# Patient Record
Sex: Female | Born: 2000 | Race: Black or African American | Hispanic: No | Marital: Single | Smoking: Never smoker
Health system: Southern US, Community
[De-identification: ages and names within clinical notes are randomized; demographics above are authoritative.]

## PROBLEM LIST (undated history)

## (undated) DIAGNOSIS — A549 Gonococcal infection, unspecified: Secondary | ICD-10-CM

## (undated) HISTORY — PX: WISDOM TOOTH EXTRACTION: SHX21

---
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
## 2018-04-10 ENCOUNTER — Emergency Department (HOSPITAL_COMMUNITY): Payer: Self-pay

## 2018-04-10 ENCOUNTER — Emergency Department (HOSPITAL_COMMUNITY)
Admission: EM | Admit: 2018-04-10 | Discharge: 2018-04-10 | Disposition: A | Discharge status: Home / Self Care | Payer: Self-pay | Attending: Emergency Medicine | Admitting: Emergency Medicine

## 2018-04-10 ENCOUNTER — Encounter (HOSPITAL_COMMUNITY): Payer: Self-pay

## 2018-04-10 DIAGNOSIS — R51 Headache: Secondary | ICD-10-CM | POA: Insufficient documentation

## 2018-04-10 DIAGNOSIS — R0981 Nasal congestion: Secondary | ICD-10-CM | POA: Insufficient documentation

## 2018-04-10 DIAGNOSIS — R61 Generalized hyperhidrosis: Secondary | ICD-10-CM | POA: Insufficient documentation

## 2018-04-10 DIAGNOSIS — J3489 Other specified disorders of nose and nasal sinuses: Secondary | ICD-10-CM | POA: Insufficient documentation

## 2018-04-10 DIAGNOSIS — M79621 Pain in right upper arm: Secondary | ICD-10-CM

## 2018-04-10 DIAGNOSIS — R2231 Localized swelling, mass and lump, right upper limb: Secondary | ICD-10-CM | POA: Insufficient documentation

## 2018-04-10 DIAGNOSIS — Z113 Encounter for screening for infections with a predominantly sexual mode of transmission: Secondary | ICD-10-CM | POA: Insufficient documentation

## 2018-04-10 DIAGNOSIS — R0602 Shortness of breath: Secondary | ICD-10-CM | POA: Insufficient documentation

## 2018-04-10 DIAGNOSIS — R2232 Localized swelling, mass and lump, left upper limb: Secondary | ICD-10-CM | POA: Insufficient documentation

## 2018-04-10 MED ORDER — CETIRIZINE HCL 10 MG PO TABS: 10.0000 mg | ORAL_TABLET | Freq: Every day | ORAL | 0 refills | Status: AC

## 2018-04-10 NOTE — ED Triage Notes (Signed)
Pt from home c/o bilateral swollen axillas, sinus congestion, headaches, and intermittent back and extremity pain. Pt reports only pain currently is 3/10 HA and right armpit x months. Pt also endorses extremity tingling, none currently. Pt reports she was seen previously and they ruled out abscesses in axilla, but pain has still persisted.

## 2018-04-10 NOTE — Discharge Instructions (Addendum)
The cause of your arm pain was not identified today. Please follow-up to establish family doctor for recheck and further evaluation. You can take ibuprofen, available over-the-counter for pain.

## 2018-04-10 NOTE — ED Provider Notes (Signed)
[facility] EMERGENCY DEPARTMENT Provider Note   CSN: [id number] Arrival date & time: 04/10/18  1534     History   Chief Complaint Chief Complaint  Patient presents with  . Headache    HPI Jamie Anthony is a 18 y.o. female.  The history is provided by the patient and medical records. No language interpreter was used.  Headache   presents to the emergency department complaining of pain in her axilla. She reports swelling and nodules intermittently in her axilla bilaterally for the last several months. Swelling is greater in the right than the left. She denies any fevers. She has occasional shortness of breath, none currently. She has experienced night sweats for her entire life. She does have 80 pounds weight loss over the last year but she has been trying to lose weight. She is concerned that she might have HIV. She is sexually active but uses protection. She also complains of sinus congestion, intermittent back pain as well as nasal congestion and drainage since August. Past Medical History:  Diagnosis Date  . Gonorrhea     There are no active problems to display for this patient.   History reviewed. No pertinent surgical history.   OB History   No obstetric history on file.      Home Medications    Prior to Admission medications   Medication Sig Start Date End Date Taking? Authorizing Provider  ibuprofen (ADVIL,MOTRIN) 200 MG tablet Take 400 mg by mouth every 6 (six) hours as needed for headache.    [provider]  IRON PO Take 1 tablet by mouth daily as needed (For when she is feeling low.).    [provider]  lidocaine (XYLOCAINE) 2 % solution Use as directed 15 mLs in the mouth or throat as needed for mouth pain. 12/22/17   Mortis, Jerrel Ivory I, PA-C  metroNIDAZOLE (FLAGYL) 500 MG tablet Take 1 tablet (500 mg total) by mouth 2 (two) times daily. One po bid x 7 days Patient not taking: Reported on 12/22/2017 12/17/17   Molpus,  Jonny Ruiz, MD    Family History History reviewed. No pertinent family history.  Social History Social History   Tobacco Use  . Smoking status: Never Smoker  . Smokeless tobacco: Never Used  Substance Use Topics  . Alcohol use: No    Frequency: Never  . Drug use: No     Allergies   Patient has no known allergies.   Review of Systems Review of Systems  Neurological: Positive for headaches.  All other systems reviewed and are negative.    Physical Exam Updated Vital Signs BP 120/60 (BP Location: Right Arm)   Pulse 62   Temp 98.1 F (36.7 C) (Oral)   Resp 16   Ht 5\' 7"  (1.702 m)   Wt 81.6 kg   LMP 04/03/2018   SpO2 100%   BMI 28.19 kg/m   Physical Exam Vitals signs and nursing note reviewed.  Constitutional:      Appearance: She is well-developed.  HENT:     Head: Normocephalic and atraumatic.     Nose: Nose normal. No rhinorrhea.     Mouth/Throat:     Mouth: Mucous membranes are moist.  Cardiovascular:     Rate and Rhythm: Normal rate and regular rhythm.     Heart sounds: No murmur.  Pulmonary:     Effort: Pulmonary effort is normal. No respiratory distress.     Breath sounds: Normal breath sounds.  Abdominal:  Palpations: Abdomen is soft.     Tenderness: There is no abdominal tenderness. There is no guarding or rebound.  Musculoskeletal:        General: No tenderness.     Comments: No pathologic axillary or cervical lymphadenopathy  Skin:    General: Skin is warm and dry.     Capillary Refill: Capillary refill takes less than 2 seconds.  Neurological:     Mental Status: She is alert and oriented to person, place, and time.     Comments: MAE symmetrically  Psychiatric:        Behavior: Behavior normal.      ED Treatments / Results  Labs (all labs ordered are listed, but only abnormal results are displayed) Labs Reviewed  RPR  HIV ANTIBODY (ROUTINE TESTING W REFLEX)    EKG None  Radiology No results found.  Procedures Procedures  (including critical care time)  Medications Ordered in ED Medications - No data to display   Initial Impression / Assessment and Plan / ED Course  I have reviewed the triage vital signs and the nursing notes.  Pertinent labs & imaging results that were available during my care of the patient were reviewed by me and considered in my medical decision making (see chart for details).     Patient here for evaluation of axillary pain, nasal congestion. She is non-toxic appearing on evaluation. She is requesting HIV testing. She has no vaginally complaints. She is sexually active but uses protection. On examination there is no pathologic lymphadenopathy or masses. No evidence of abscess. She does report night sweats but these have been a lifelong issue and not a new thing for her. She also reports weight loss but she is been attempting to lose weight. No overt clinical findings consistent with malignancy. Discussed importance of PCP follow-up for further evaluation and recheck. She also reports chronic nasal congestion and drainage. Discussed some element of allergies as possible, will try Zyrtec. Return precautions discussed.  Final Clinical Impressions(s) / ED Diagnoses   Final diagnoses:  None    ED Discharge Orders    None       Tilden Fossaees, Nerida Boivin, MD 04/10/18 Flossie [id number]

## 2018-04-10 NOTE — ED Notes (Signed)
Pt verbalized understanding of discharge instructions and denies any further questions at this time.   

## 2018-04-10 NOTE — ED Notes (Signed)
Pt ambulated to bathroom independently. Will go to X-Ray after returning.

## 2018-04-10 NOTE — ED Notes (Signed)
Patient transported to X-ray 

## 2018-04-10 NOTE — ED Notes (Signed)
ED Provider at bedside. 

## 2018-04-11 LAB — RPR: RPR Ser Ql: NONREACTIVE

## 2018-04-11 LAB — HIV ANTIBODY (ROUTINE TESTING W REFLEX): HIV Screen 4th Generation wRfx: NONREACTIVE

## 2020-03-19 ENCOUNTER — Other Ambulatory Visit: Payer: Self-pay

## 2020-03-19 ENCOUNTER — Encounter (HOSPITAL_COMMUNITY): Payer: Self-pay

## 2020-03-19 ENCOUNTER — Emergency Department (HOSPITAL_COMMUNITY)
Admission: EM | Admit: 2020-03-19 | Discharge: 2020-03-19 | Disposition: A | Discharge status: Home / Self Care | Payer: Self-pay | Attending: Emergency Medicine | Admitting: Emergency Medicine

## 2020-03-19 ENCOUNTER — Emergency Department (HOSPITAL_COMMUNITY): Payer: Self-pay

## 2020-03-19 DIAGNOSIS — O021 Missed abortion: Secondary | ICD-10-CM | POA: Insufficient documentation

## 2020-03-19 DIAGNOSIS — Z3A11 11 weeks gestation of pregnancy: Secondary | ICD-10-CM | POA: Insufficient documentation

## 2020-03-19 DIAGNOSIS — N760 Acute vaginitis: Secondary | ICD-10-CM

## 2020-03-19 DIAGNOSIS — B9689 Other specified bacterial agents as the cause of diseases classified elsewhere: Secondary | ICD-10-CM

## 2020-03-19 LAB — COMPREHENSIVE METABOLIC PANEL
ALT: 11 U/L (ref 0–44)
AST: 13 U/L — ABNORMAL LOW (ref 15–41)
Albumin: 4.1 g/dL (ref 3.5–5.0)
Alkaline Phosphatase: 33 U/L — ABNORMAL LOW (ref 38–126)
Anion gap: 8 (ref 5–15)
BUN: 6 mg/dL (ref 6–20)
CO2: 26 mmol/L (ref 22–32)
Calcium: 9.4 mg/dL (ref 8.9–10.3)
Chloride: 103 mmol/L (ref 98–111)
Creatinine, Ser: 0.61 mg/dL (ref 0.44–1.00)
GFR, Estimated: >60 mL/min (ref >60)
Glucose, Bld: 73 mg/dL (ref 70–99)
Potassium: 3.7 mmol/L (ref 3.5–5.1)
Sodium: 137 mmol/L (ref 135–145)
Total Bilirubin: 0.9 mg/dL (ref 0.3–1.2)
Total Protein: 7.6 g/dL (ref 6.5–8.1)

## 2020-03-19 LAB — URINALYSIS, ROUTINE W REFLEX MICROSCOPIC
Bilirubin Urine: NEGATIVE
Glucose, UA: NEGATIVE mg/dL
Hgb urine dipstick: NEGATIVE
Ketones, ur: NEGATIVE mg/dL
Leukocytes,Ua: NEGATIVE
Nitrite: NEGATIVE
Protein, ur: NEGATIVE mg/dL
Specific Gravity, Urine: 1.004 — ABNORMAL LOW (ref 1.005–1.030)
pH: 7 (ref 5.0–8.0)

## 2020-03-19 LAB — CBC WITH DIFFERENTIAL/PLATELET
Abs Immature Granulocytes: 0.04 10*3/uL (ref 0.00–0.07)
Basophils Absolute: 0 10*3/uL (ref 0.0–0.1)
Basophils Relative: 0 %
Eosinophils Absolute: 0.2 10*3/uL (ref 0.0–0.5)
Eosinophils Relative: 2 %
HCT: 36.5 % (ref 36.0–46.0)
Hemoglobin: 12.5 g/dL (ref 12.0–15.0)
Immature Granulocytes: 1 %
Lymphocytes Relative: 27 %
Lymphs Abs: 2.2 10*3/uL (ref 0.7–4.0)
MCH: 29.7 pg (ref 26.0–34.0)
MCHC: 34.2 g/dL (ref 30.0–36.0)
MCV: 86.7 fL (ref 80.0–100.0)
Monocytes Absolute: 0.7 10*3/uL (ref 0.1–1.0)
Monocytes Relative: 8 %
Neutro Abs: 4.9 10*3/uL (ref 1.7–7.7)
Neutrophils Relative %: 62 %
Platelets: 245 10*3/uL (ref 150–400)
RBC: 4.21 MIL/uL (ref 3.87–5.11)
RDW: 13.2 % (ref 11.5–15.5)
WBC: 8 10*3/uL (ref 4.0–10.5)
nRBC: 0 % (ref 0.0–0.2)

## 2020-03-19 LAB — WET PREP, GENITAL
Sperm: NONE SEEN
Trich, Wet Prep: NONE SEEN
Yeast Wet Prep HPF POC: NONE SEEN

## 2020-03-19 LAB — ABO/RH: ABO/RH(D): B POS

## 2020-03-19 LAB — LIPASE, BLOOD: Lipase: 35 U/L (ref 11–51)

## 2020-03-19 LAB — HCG, QUANTITATIVE, PREGNANCY: hCG, Beta Chain, Quant, S: 84754 m[IU]/mL — ABNORMAL HIGH (ref <5)

## 2020-03-19 MED ORDER — MORPHINE SULFATE (PF) 4 MG/ML IV SOLN
4.0000 mg | Freq: Once | INTRAVENOUS | Status: AC
  Administered 2020-03-19: 4 mg via INTRAVENOUS
  Filled 2020-03-19: qty 1

## 2020-03-19 MED ORDER — METRONIDAZOLE 500 MG PO TABS: 500.0000 mg | ORAL_TABLET | Freq: Two times a day (BID) | ORAL | 0 refills | Status: AC

## 2020-03-19 NOTE — ED Notes (Signed)
Pt is in ultrasound will update vitals when procedure is finished.

## 2020-03-19 NOTE — ED Triage Notes (Signed)
Patient states she was told that she had a uterus and a fallopian tube pregnancy. Patient sates the uterine pregnancy she miscarried. Patient states she is having left lower side pain and was "given pills to finish the miscarriage and was suppose to start today, but did not do so under the direction of the OB/GYN.

## 2020-03-19 NOTE — Discharge Instructions (Addendum)
Please pick up cytotec that was prescribed by your OBGYN and take as prescribed. Please follow up with them in 1 week for reevaluation of your missed abortion.   You were also found to have bacterial vaginosis today. Please take antibiotics as prescribed. DO NOT DRINK ALCOHOL WHILE ON THIS MEDICATION. We have also tested you for gonorrhea and chlamydia - we will call you if you test positive. Please refrain from intercourse until you hear about your results.   Return to the ED for any worsening symptoms.

## 2020-03-19 NOTE — ED Provider Notes (Signed)
[facility]-EMERGENCY DEPT Provider Note   CSN: [id number] Arrival date & time: 03/19/20  1713     History Chief Complaint  Patient presents with  . tube pregnancy    Jamie Anthony is a 19 y.o. female who presents to the ED today with complaint of "tubal pregnancy." Pt reports that her LNMP was 10/14. She was seen in an ED in [place], [place] on 12/11 and had an ultrasound done - reports she was told that she had an intrauterine pregnancy however also had a possible tubal pregnancy. She followed up with her OBGYN who agreed and provided her with Cytotec. Pt states that she was supposed to take the cytotec today however a couple of days ago began having dizziness and abdominal pain that she describes as sharp in nature. She called her OBGYN today who told her to come to the ED for further evaluation. Pt also reports she has been having nausea and NBNB emesis x 1 week with subjective fevers and chills.   The history is provided by the patient and medical records.       Past Medical History:  Diagnosis Date  . Gonorrhea     There are no problems to display for this patient.   Past Surgical History:  Procedure Laterality Date  . WISDOM TOOTH EXTRACTION       OB History   No obstetric history on file.     Family History  Problem Relation Age of Onset  . Healthy Mother   . Healthy Father     Social History   Tobacco Use  . Smoking status: Never Smoker  . Smokeless tobacco: Never Used  Vaping Use  . Vaping Use: Every day  . Substances: Nicotine  Substance Use Topics  . Alcohol use: No  . Drug use: No    Home Medications Prior to Admission medications   Medication Sig Start Date End Date Taking? Authorizing Provider  metroNIDAZOLE (FLAGYL) 500 MG tablet Take 1 tablet (500 mg total) by mouth 2 (two) times daily. 03/19/20  Yes Vanesha Athens, PA-C  cetirizine (ZYRTEC) 10 MG tablet Take 1 tablet (10 mg total) by mouth daily. Patient not taking:  No sig reported 04/10/18   Tilden Fossa, MD  lidocaine (XYLOCAINE) 2 % solution Use as directed 15 mLs in the mouth or throat as needed for mouth pain. Patient not taking: No sig reported 12/22/17   Dagoberto Ligas I, PA-C    Allergies    Patient has no known allergies.  Review of Systems   Review of Systems  Constitutional: Positive for chills and fever.  Gastrointestinal: Positive for abdominal pain, nausea and vomiting.  Genitourinary: Positive for pelvic pain.  All other systems reviewed and are negative.   Physical Exam Updated Vital Signs BP 131/69 (BP Location: Right Arm)   Pulse 68   Temp 98.2 F (36.8 C) (Oral)   Resp 14   Ht 5\' 7"  (1.702 m)   Wt 84.8 kg   LMP 01/01/2020   SpO2 100%   BMI 29.29 kg/m   Physical Exam Vitals and nursing note reviewed.  Constitutional:      Appearance: She is not ill-appearing.  HENT:     Head: Normocephalic and atraumatic.  Eyes:     Conjunctiva/sclera: Conjunctivae normal.  Cardiovascular:     Rate and Rhythm: Normal rate and regular rhythm.     Pulses: Normal pulses.  Pulmonary:     Effort: Pulmonary effort is normal.     Breath  sounds: Normal breath sounds. No wheezing, rhonchi or rales.  Abdominal:     Palpations: Abdomen is soft.     Tenderness: There is abdominal tenderness. There is no right CVA tenderness, left CVA tenderness, guarding or rebound.     Comments: Soft, + diffuse lower abdominal TTP, +BS throughout, no r/g/r, neg murphy's, neg mcburney's, no CVA TTP  Genitourinary:    Comments: Chaperone present for exam. No rashes, lesions, or tenderness to external genitalia. No erythema, injury, or tenderness to vaginal mucosa. Light white vaginal discharge in vault. No adnexal masses, tenderness, or fullness. No CMT, cervical friability, or discharge from cervical os. Cervical os is closed. Uterus non-deviated, mobile, nonTTP, and without enlargement.  Musculoskeletal:     Cervical back: Neck supple.  Skin:     General: Skin is warm and dry.  Neurological:     Mental Status: She is alert.     ED Results / Procedures / Treatments   Labs (all labs ordered are listed, but only abnormal results are displayed) Labs Reviewed  WET PREP, GENITAL - Abnormal; Notable for the following components:      Result Value   Clue Cells Wet Prep HPF POC PRESENT (*)    WBC, Wet Prep HPF POC MODERATE (*)    All other components within normal limits  COMPREHENSIVE METABOLIC PANEL - Abnormal; Notable for the following components:   AST 13 (*)    Alkaline Phosphatase 33 (*)    All other components within normal limits  URINALYSIS, ROUTINE W REFLEX MICROSCOPIC - Abnormal; Notable for the following components:   Color, Urine STRAW (*)    Specific Gravity, Urine 1.004 (*)    All other components within normal limits  HCG, QUANTITATIVE, PREGNANCY - Abnormal; Notable for the following components:   hCG, Beta Chain, Quant, S 84,754 (*)    All other components within normal limits  LIPASE, BLOOD  CBC WITH DIFFERENTIAL/PLATELET  ABO/RH  GC/CHLAMYDIA PROBE AMP ([place]) NOT AT [facility]    EKG None  Radiology US OB Comp < 14 Wks  Result Date: 03/19/2020 CLINICAL DATA:  Patient reports having had coexisting intrauterine pregnancy and ectopic pregnancy, evaluated at an outside institution. Patient reports she was treated for the intrauterine pregnancy and given medication for the ectopic but did not take medication. Now presents for further evaluation. Estimated gestational age by LMP is 11 weeks 1 day. Quantitative beta hCG today is in process but was 147374 on 03/10/2020. EXAM: OBSTETRIC <14 WK US AND TRANSVAGINAL OB US TECHNIQUE: Both transabdominal and transvaginal ultrasound examinations were performed for complete evaluation of the gestation as well as the maternal uterus, adnexal regions, and pelvic cul-de-sac. Transvaginal technique was performed to assess early pregnancy. COMPARISON:  None. FINDINGS:  Intrauterine gestational sac: A single intrauterine gestational sac with gestational reaction is present. Yolk sac: There is a cystic structure within the endometrial sac within wall. This may be and amnion or enlarged yolk sac. Embryo: Heterogeneous debris is demonstrated within the gestational sac. No formed embryo is identified. Cardiac Activity: No fetal cardiac activity is seen. MSD: 41.5 mm   9 w   5 d Subchorionic hemorrhage:  None visualized. Maternal uterus/adnexae: The uterus is anteverted. No myometrial mass lesions are identified. Both ovaries are identified. The left ovary measures 3.2 x 1.4 x 2.6 cm with volume of 6 ml. Normal appearance. Flow is demonstrated in the ovary on color flow Doppler imaging. The right ovary measures 3.6 x 2.5 by 2.3 cm with a  volume of 11 mL. There is a 2 cm diameter thick-walled cystic structure contiguous with the right ovary. This does not separate from the right ovary on compression and likely represents a corpus luteal cyst. There is another paraovarian cyst anterior to the right ovary measuring about 2.6 cm in diameter. This is a simple appearing cyst. No definite ectopic pregnancy identified. Minimal free fluid in the pelvis. IMPRESSION: 1. There is an irregular intrauterine gestational sac. No fetal pole is identified. 2. 2 cm thick-walled cystic structure demonstrated in the right ovary appears to be arising from the ovary. This is likely a corpus luteal cyst. No definite ectopic pregnancy. 3. Left ovary is normal. Electronically Signed   By: Burman Nieves M.D.   On: 03/19/2020 20:39   US OB Transvaginal  Result Date: 03/19/2020 CLINICAL DATA:  Patient reports having had coexisting intrauterine pregnancy and ectopic pregnancy, evaluated at an outside institution. Patient reports she was treated for the intrauterine pregnancy and given medication for the ectopic but did not take medication. Now presents for further evaluation. Estimated gestational age by LMP  is 11 weeks 1 day. Quantitative beta hCG today is in process but was [id number] on 03/10/2020. EXAM: OBSTETRIC <14 WK [place] AND TRANSVAGINAL OB US TECHNIQUE: Both transabdominal and transvaginal ultrasound examinations were performed for complete evaluation of the gestation as well as the maternal uterus, adnexal regions, and pelvic cul-de-sac. Transvaginal technique was performed to assess early pregnancy. COMPARISON:  None. FINDINGS: Intrauterine gestational sac: A single intrauterine gestational sac with gestational reaction is present. Yolk sac: There is a cystic structure within the endometrial sac within wall. This may be and amnion or enlarged yolk sac. Embryo: Heterogeneous debris is demonstrated within the gestational sac. No formed embryo is identified. Cardiac Activity: No fetal cardiac activity is seen. MSD: 41.5 mm   9 w   5 d Subchorionic hemorrhage:  None visualized. Maternal uterus/adnexae: The uterus is anteverted. No myometrial mass lesions are identified. Both ovaries are identified. The left ovary measures 3.2 x 1.4 x 2.6 cm with volume of 6 ml. Normal appearance. Flow is demonstrated in the ovary on color flow Doppler imaging. The right ovary measures 3.6 x 2.5 by 2.3 cm with a volume of 11 mL. There is a 2 cm diameter thick-walled cystic structure contiguous with the right ovary. This does not separate from the right ovary on compression and likely represents a corpus luteal cyst. There is another paraovarian cyst anterior to the right ovary measuring about 2.6 cm in diameter. This is a simple appearing cyst. No definite ectopic pregnancy identified. Minimal free fluid in the pelvis. IMPRESSION: 1. There is an irregular intrauterine gestational sac. No fetal pole is identified. 2. 2 cm thick-walled cystic structure demonstrated in the right ovary appears to be arising from the ovary. This is likely a corpus luteal cyst. No definite ectopic pregnancy. 3. Left ovary is normal. Electronically Signed    By: Burman Nieves M.D.   On: 03/19/2020 20:39    Procedures Procedures (including critical care time)  Medications Ordered in ED Medications  morphine 4 MG/ML injection 4 mg (4 mg Intravenous Given 03/19/20 2115)    ED Course  I have reviewed the triage vital signs and the nursing notes.  Pertinent labs & imaging results that were available during my care of the patient were reviewed by me and considered in my medical decision making (see chart for details).    MDM Rules/Calculators/A&P  19 year old female who presents to the ED today complaining of tubal pregnancy.  Last normal menstrual period 10/14.  States that she had an ultrasound done at an ED in [place] earlier this month with confirmed intrauterine pregnancy however also concern for an ectopic pregnancy.  She was prescribed Cytotec by her OB/GYN and was supposed to take it today however did not due to having complaints of worsening abdominal pain and dizziness over the last week with associated nausea, vomiting, objective fevers, chills.  On arrival to the ED vitals are stable.  Patient is afebrile, nontachycardic nontachypneic and appears to be in no acute distress.  On exam she has diffuse lower abdominal tenderness palpation without rebound or guarding.  I am unable to see her ultrasound in our system however I can see an elevated beta hCG.  We will plan for an hCG quant, lab work including CBC, CMP, lipase, ABO Rh, urinalysis.  We will also plan to do a pelvic exam and perform pelvic ultrasound at this time.   CBC without leukocytosis. Hgb stable at 12.5.  CMP without electrolyte abnormalities. LFTs unremarkable.  Lipase 25  Pelvic ultrasound: IMPRESSION:  1. There is an irregular intrauterine gestational sac. No fetal pole  is identified.  2. 2 cm thick-walled cystic structure demonstrated in the right  ovary appears to be arising from the ovary. This is likely a corpus  luteal cyst. No  definite ectopic pregnancy.  3. Left ovary is normal.   Quant [id number]; down from 147,374.8 on 12/21.   Pelvic exam performed - Os is closed. Pt has light vaginal discharge in vault; no bleeding or clots appreciated.   Discussed case with OBGYN Dr.Williams who recommends that she take her cytotec and to follow up with her OBGYN in 1 week.   U/A negative for infection. Wet prep positive for clue cells. Will treat for BV at this time.   Pt updated on results. She is in agreement to pick up the cytotec from the pharmacy and take as prescribed. She will also take the flagyl as prescribed and await her gonorrhea and chlamydia results. Pt instructed to follow up with OBGYN in 1 week. She is stable for discharge home.   This note was prepared using Dragon voice recognition software and may include unintentional dictation errors due to the inherent limitations of voice recognition software.  Final Clinical Impression(s) / ED Diagnoses Final diagnoses:  Bacterial vaginosis  Missed abortion    Rx / DC Orders ED Discharge Orders         Ordered    metroNIDAZOLE (FLAGYL) 500 MG tablet  2 times daily        03/19/20 2158           Discharge Instructions     Please pick up cytotec that was prescribed by your OBGYN and take as prescribed. Please follow up with them in 1 week for reevaluation of your missed abortion.   You were also found to have bacterial vaginosis today. Please take antibiotics as prescribed. DO NOT DRINK ALCOHOL WHILE ON THIS MEDICATION. We have also tested you for gonorrhea and chlamydia - we will call you if you test positive. Please refrain from intercourse until you hear about your results.   Return to the ED for any worsening symptoms.        Tanda Rockers, PA-C 03/19/20 2200    Milagros Loll, MD 03/20/20 Burna Mortimer

## 2020-03-23 LAB — GC/CHLAMYDIA PROBE AMP (~~LOC~~) NOT AT ARMC
Chlamydia: NEGATIVE
Comment: NEGATIVE
Comment: NORMAL
Neisseria Gonorrhea: NEGATIVE

## 2022-10-24 IMAGING — US US OB COMP LESS 14 WK
2 of 3 series · 13 of 28 positions shown · non-contrast
Comparison: None.

CLINICAL DATA: Patient reports having had coexisting intrauterine
pregnancy and ectopic pregnancy, evaluated at an outside
institution. Patient reports she was treated for the intrauterine
pregnancy and given medication for the ectopic but did not take
medication. Now presents for further evaluation. Estimated
gestational age by LMP is 11 weeks 1 day. Quantitative beta hCG
today is in process but was [id number] on 03/10/2020.

EXAM:
OBSTETRIC <14 WK US AND TRANSVAGINAL OB US
TECHNIQUE: Both transabdominal and transvaginal ultrasound examinations were
performed for complete evaluation of the gestation as well as the
maternal uterus, adnexal regions, and pelvic cul-de-sac.
Transvaginal technique was performed to assess early pregnancy.

[Series 1: us ob comp less 14 wk · 12 of 126 slices shown (1 of 2)]
[im 6/126]
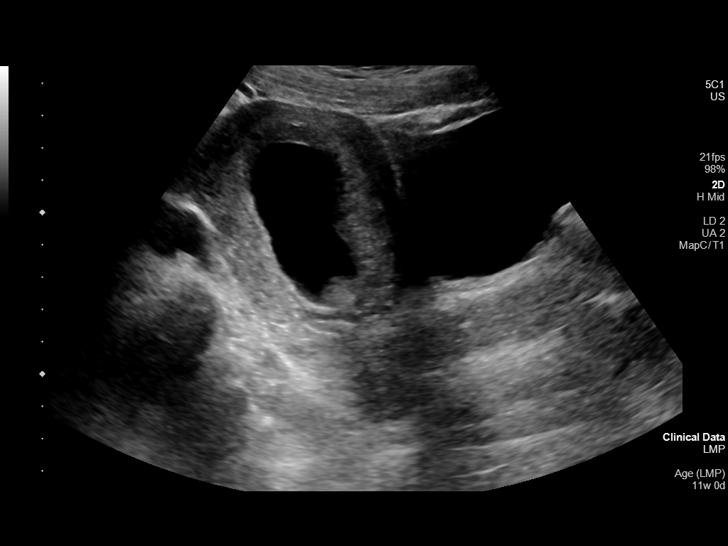
[im 16/126]
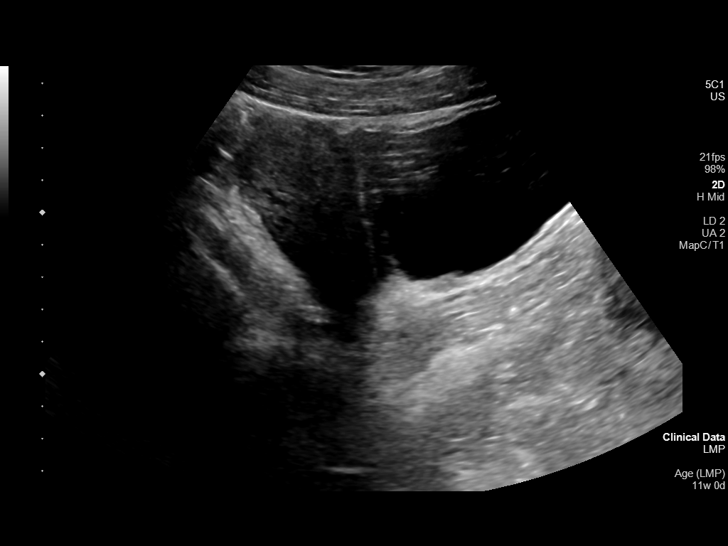
[im 26/126]
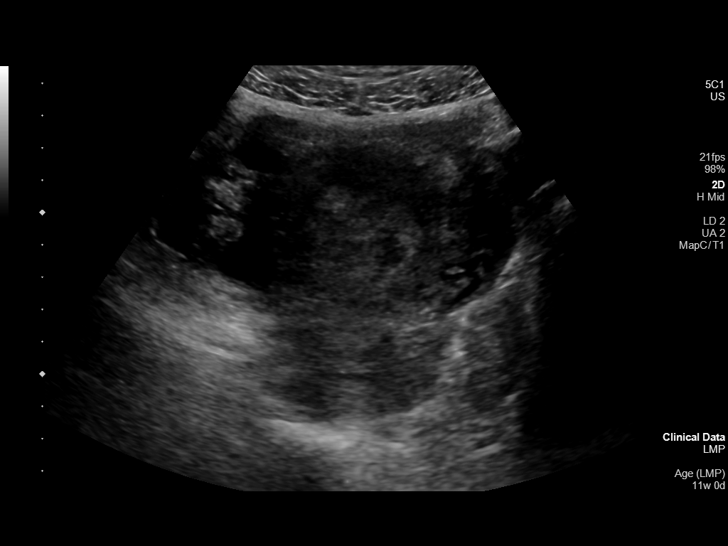
[im 36/126]
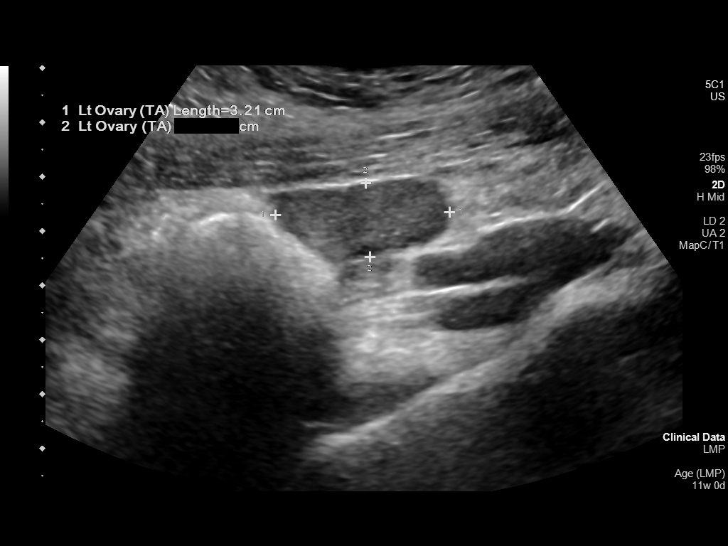
[im 46/126]
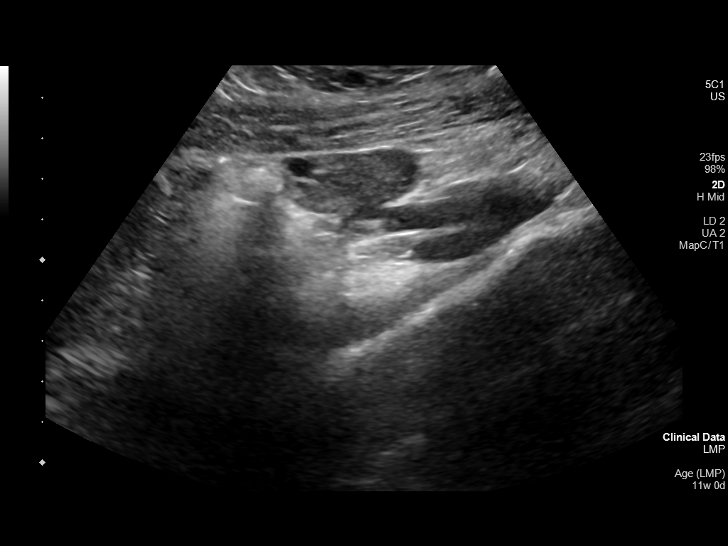
[im 56/126]
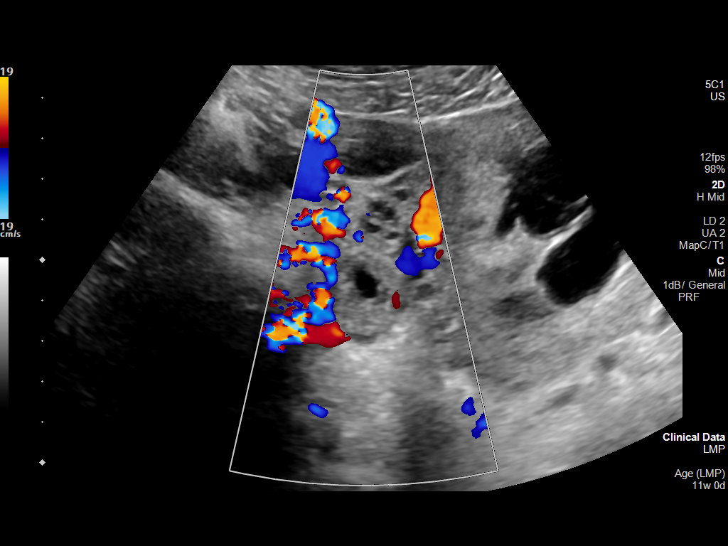
[im 71/126]
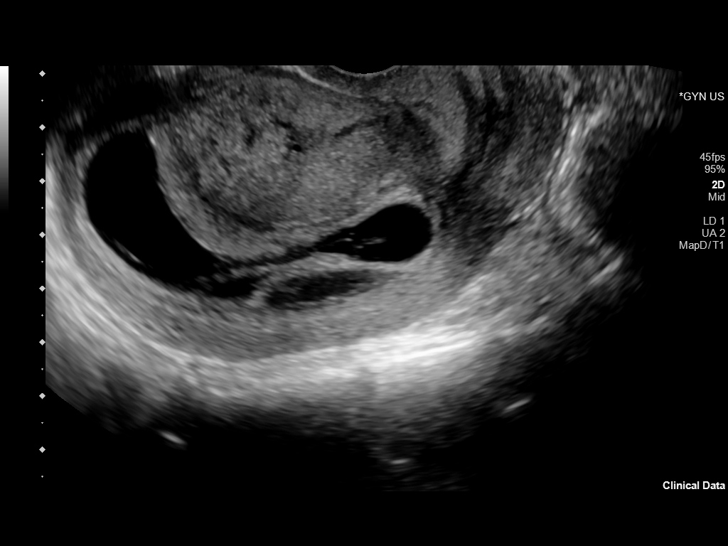
[im 81/126]
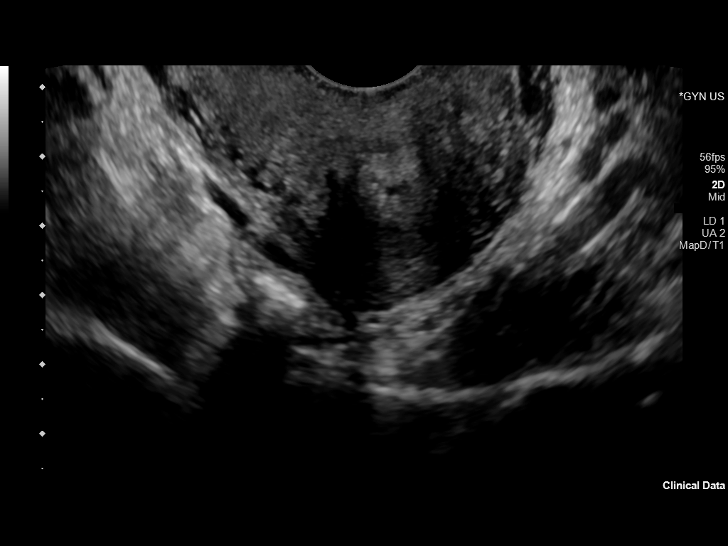
[im 91/126]
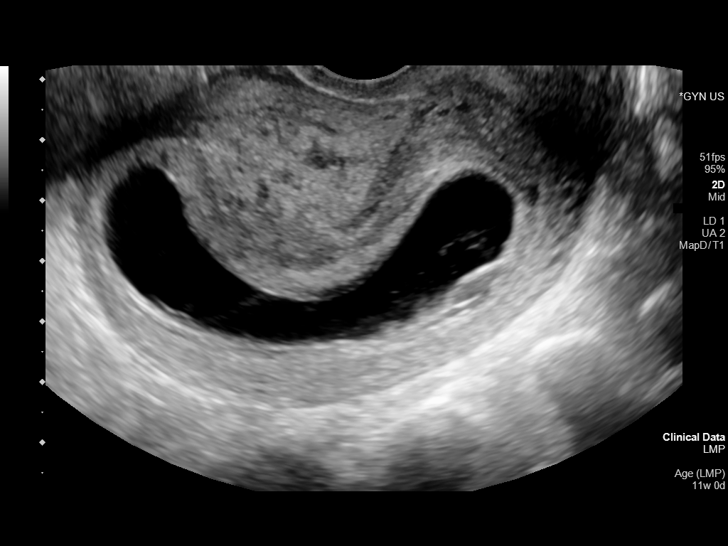
[im 101/126]
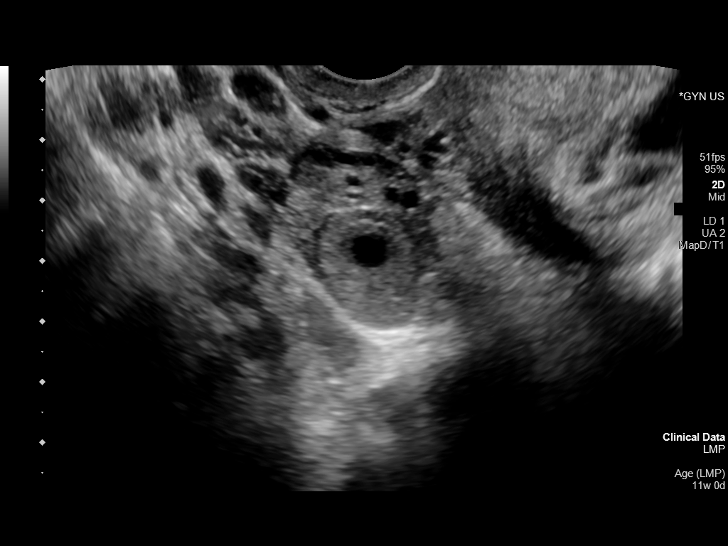
[im 111/126]
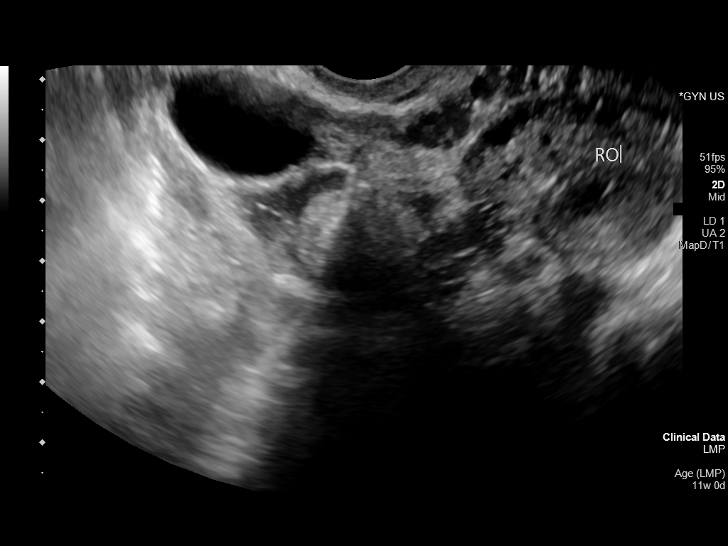
[im 121/126]
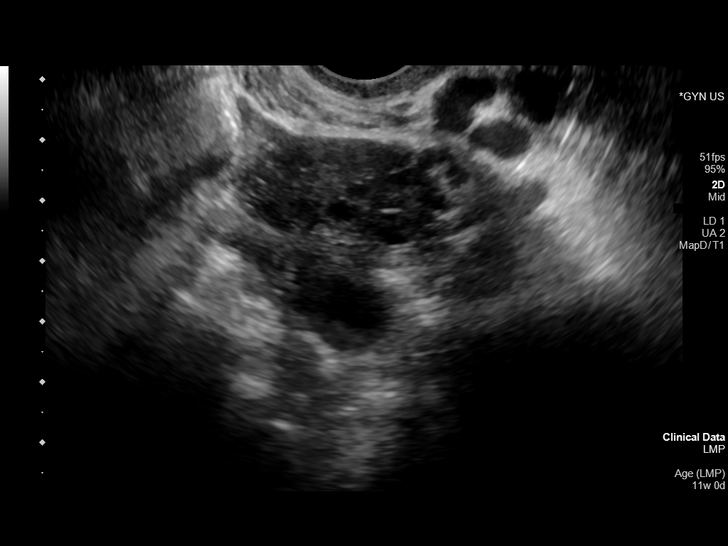

[Series 1: us ob comp less 14 wk · 4 acquisitions, 1 frame shown (2 of 2)]
[im 1/4]
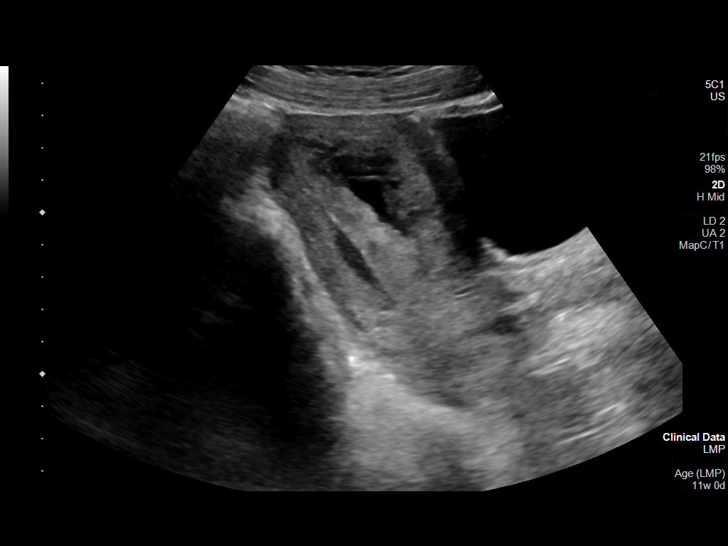

[13 of 28 positions shown; findings below may reference images not displayed]

FINDINGS: Intrauterine gestational sac: A single intrauterine gestational sac
with gestational reaction is present.

Yolk sac: There is a cystic structure within the endometrial sac
within wall. This may be and amnion or enlarged yolk sac.

Embryo: Heterogeneous debris is demonstrated within the gestational
sac. No formed embryo is identified.

Cardiac Activity: No fetal cardiac activity is seen.

MSD: 41.5 mm   9 w   5 d

Subchorionic hemorrhage:  None visualized.

Maternal uterus/adnexae: The uterus is anteverted. No myometrial
mass lesions are identified.

Both ovaries are identified. The left ovary measures 3.2 x 1.4 x
cm with volume of 6 ml. Normal appearance. Flow is demonstrated in
the ovary on color flow Doppler imaging.

The right ovary measures 3.6 x 2.5 by 2.3 cm with a volume of 11 mL.
There is a 2 cm diameter thick-walled cystic structure contiguous
with the right ovary. This does not separate from the right ovary on
compression and likely represents a corpus luteal cyst. There is
another paraovarian cyst anterior to the right ovary measuring about
2.6 cm in diameter. This is a simple appearing cyst. No definite
ectopic pregnancy identified.

Minimal free fluid in the pelvis.
IMPRESSION: 1. There is an irregular intrauterine gestational sac. No fetal pole
is identified.
2. 2 cm thick-walled cystic structure demonstrated in the right
ovary appears to be arising from the ovary. This is likely a corpus
luteal cyst. No definite ectopic pregnancy.
3. Left ovary is normal.

## 2022-10-24 IMAGING — US US OB TRANSVAGINAL
1 series · 13 of 28 positions shown · non-contrast
Comparison: None.

CLINICAL DATA: Patient reports having had coexisting intrauterine
pregnancy and ectopic pregnancy, evaluated at an outside
institution. Patient reports she was treated for the intrauterine
pregnancy and given medication for the ectopic but did not take
medication. Now presents for further evaluation. Estimated
gestational age by LMP is 11 weeks 1 day. Quantitative beta hCG
today is in process but was [id number] on 03/10/2020.

EXAM:
OBSTETRIC <14 WK US AND TRANSVAGINAL OB US
TECHNIQUE: Both transabdominal and transvaginal ultrasound examinations were
performed for complete evaluation of the gestation as well as the
maternal uterus, adnexal regions, and pelvic cul-de-sac.
Transvaginal technique was performed to assess early pregnancy.

[Series 1: us ob transvaginal · 13 of 31 slices shown]
[im 2/31]
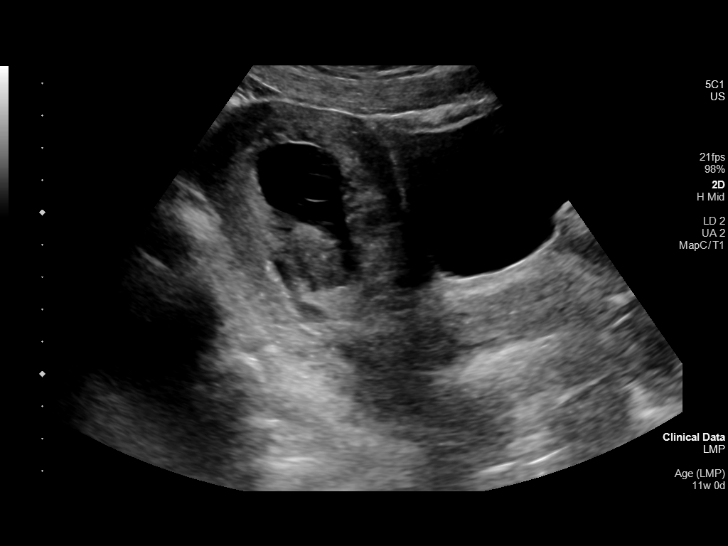
[im 4/31]
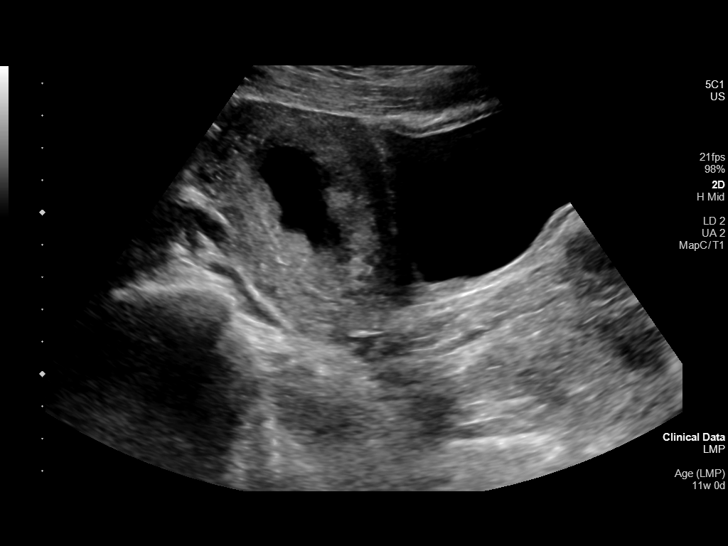
[im 6/31]
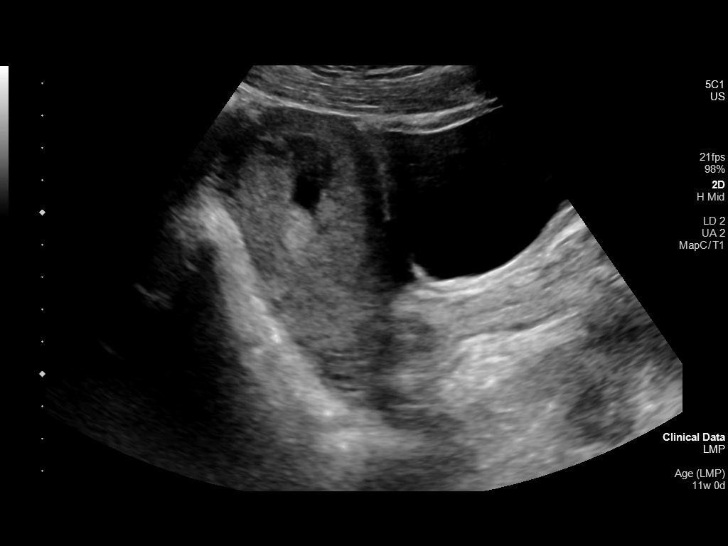
[im 8/31]
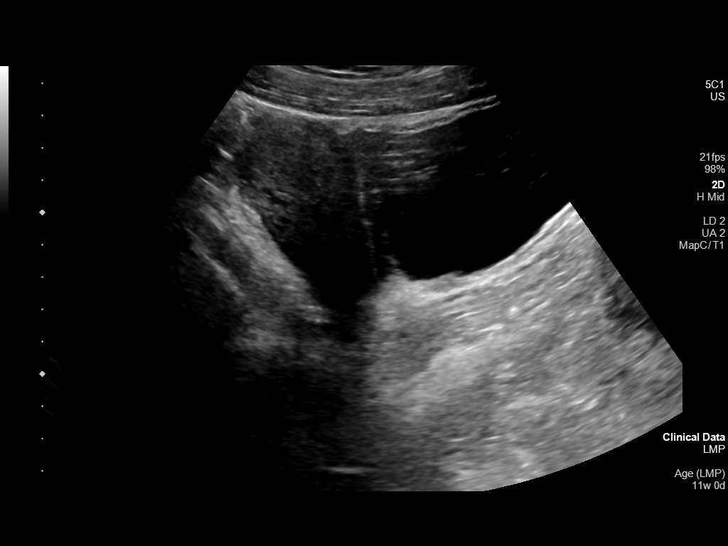
[im 11/31]
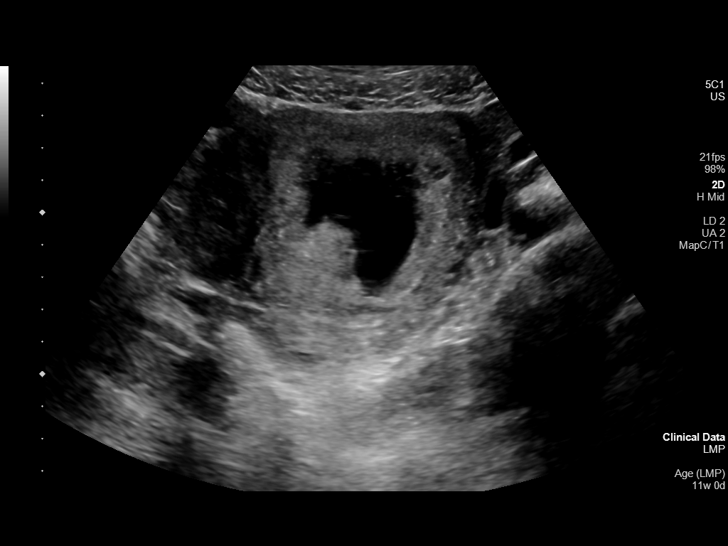
[im 13/31]
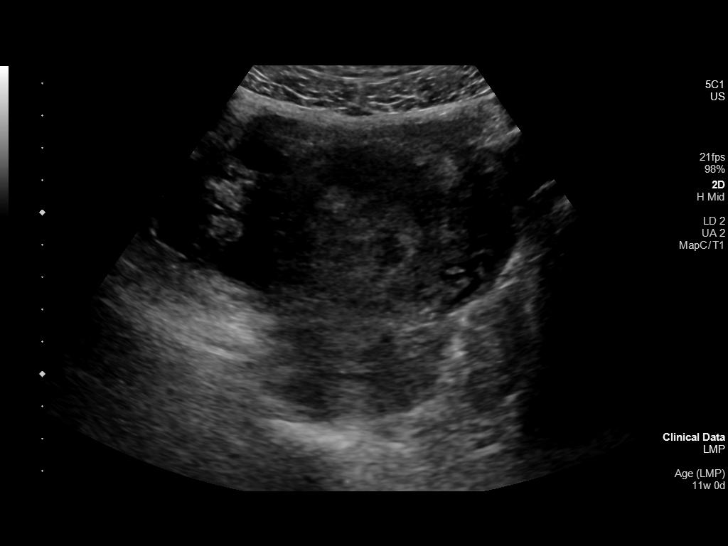
[im 16/31]
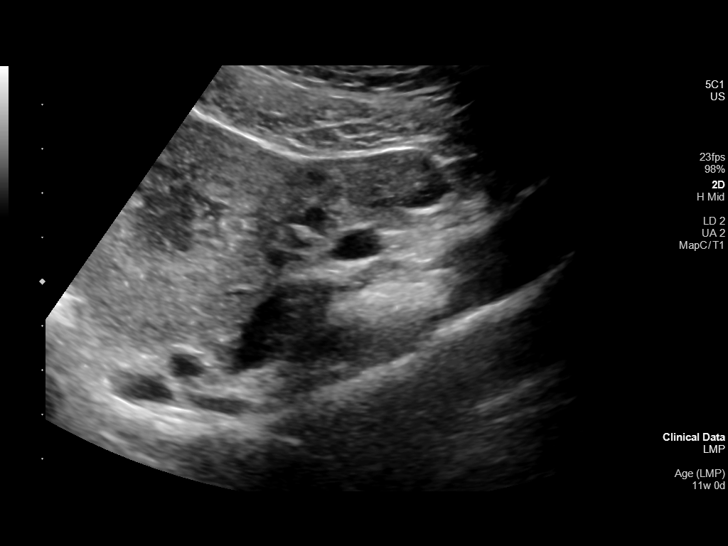
[im 18/31]
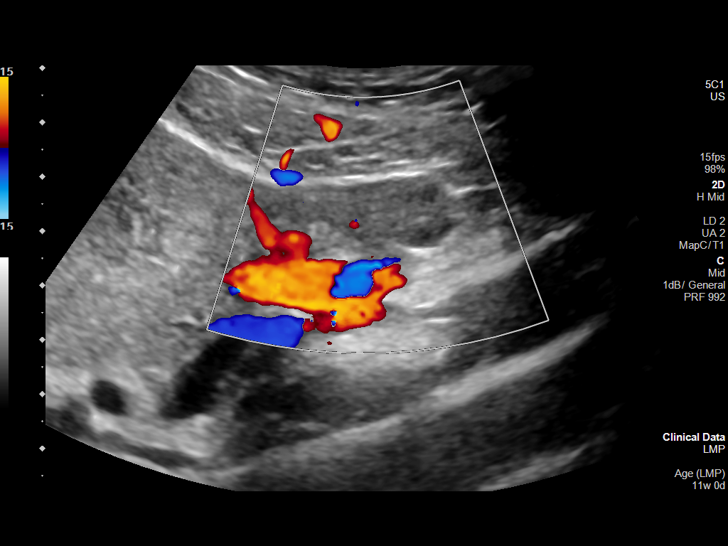
[im 21/31]
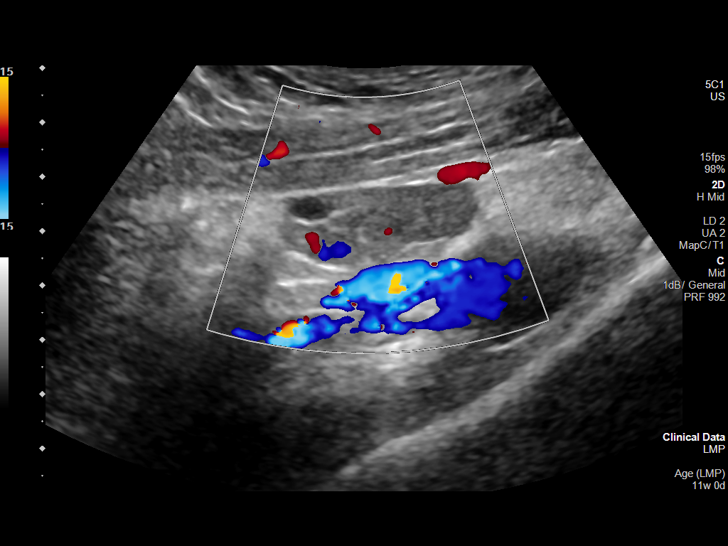
[im 23/31]
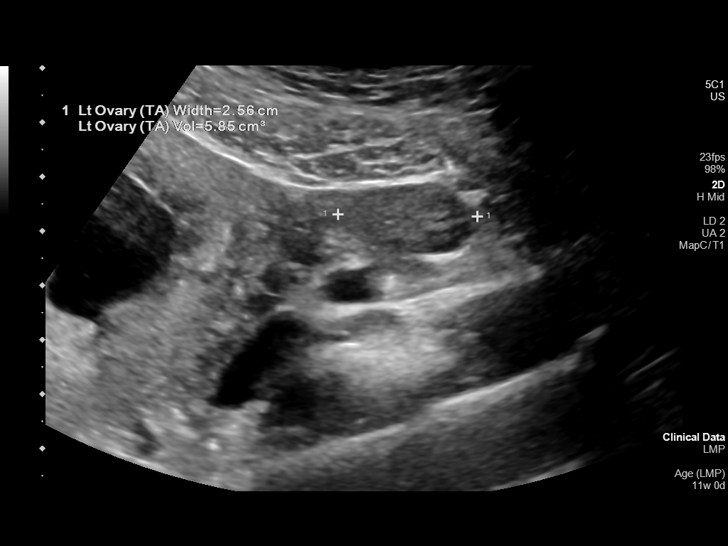
[im 25/31]
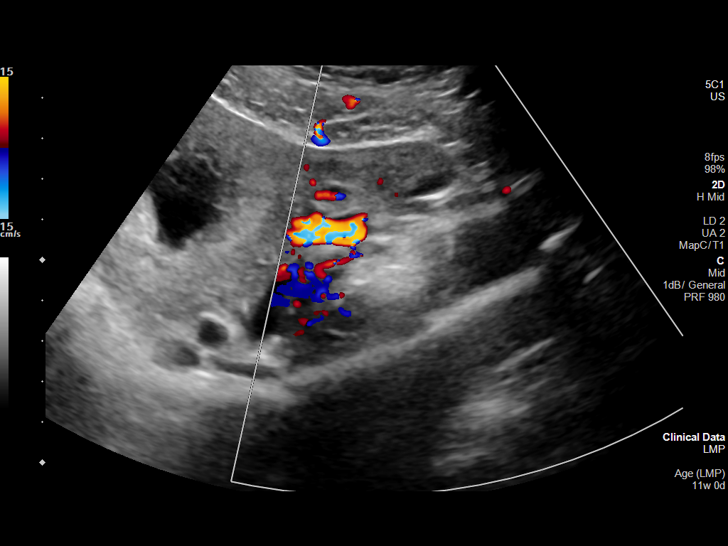
[im 27/31]
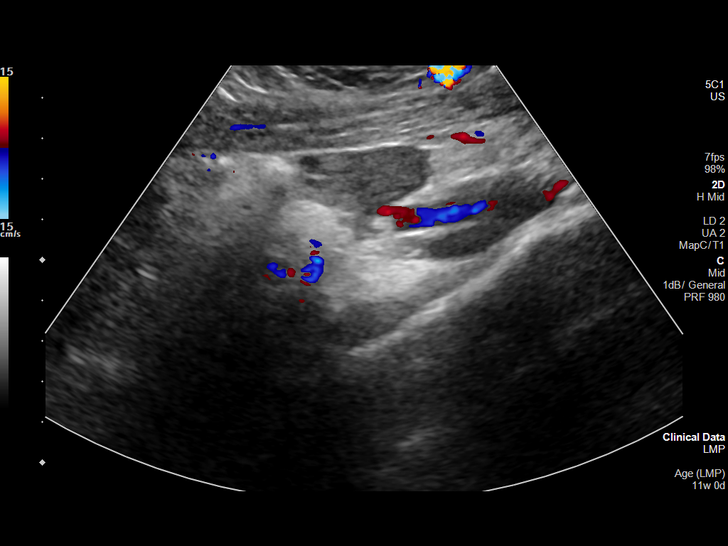
[im 29/31]
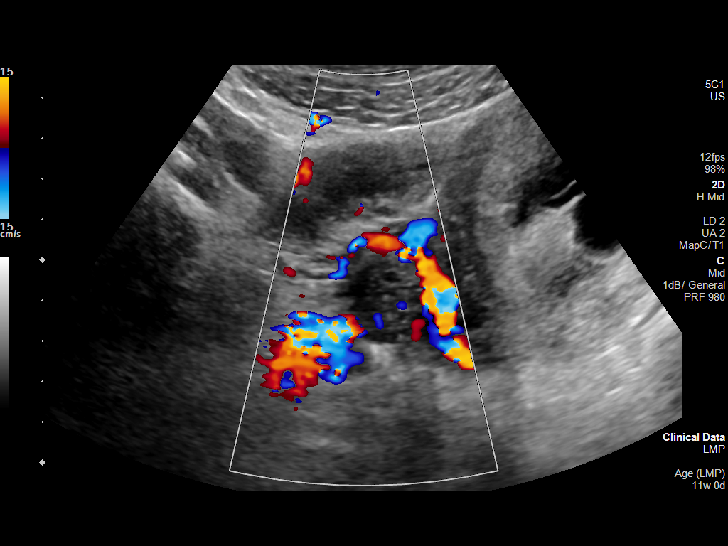

[13 of 28 positions shown; findings below may reference images not displayed]

FINDINGS: Intrauterine gestational sac: A single intrauterine gestational sac
with gestational reaction is present.

Yolk sac: There is a cystic structure within the endometrial sac
within wall. This may be and amnion or enlarged yolk sac.

Embryo: Heterogeneous debris is demonstrated within the gestational
sac. No formed embryo is identified.

Cardiac Activity: No fetal cardiac activity is seen.

MSD: 41.5 mm   9 w   5 d

Subchorionic hemorrhage:  None visualized.

Maternal uterus/adnexae: The uterus is anteverted. No myometrial
mass lesions are identified.

Both ovaries are identified. The left ovary measures 3.2 x 1.4 x
cm with volume of 6 ml. Normal appearance. Flow is demonstrated in
the ovary on color flow Doppler imaging.

The right ovary measures 3.6 x 2.5 by 2.3 cm with a volume of 11 mL.
There is a 2 cm diameter thick-walled cystic structure contiguous
with the right ovary. This does not separate from the right ovary on
compression and likely represents a corpus luteal cyst. There is
another paraovarian cyst anterior to the right ovary measuring about
2.6 cm in diameter. This is a simple appearing cyst. No definite
ectopic pregnancy identified.

Minimal free fluid in the pelvis.
IMPRESSION: 1. There is an irregular intrauterine gestational sac. No fetal pole
is identified.
2. 2 cm thick-walled cystic structure demonstrated in the right
ovary appears to be arising from the ovary. This is likely a corpus
luteal cyst. No definite ectopic pregnancy.
3. Left ovary is normal.
